# Patient Record
Sex: Male | Born: 1985 | Race: Black or African American | Hispanic: No | Marital: Married | Smoking: Current some day smoker
Health system: Southern US, Community
[De-identification: ages and names within clinical notes are randomized; demographics above are authoritative.]

## PROBLEM LIST (undated history)

## (undated) HISTORY — PX: HERNIA REPAIR: SHX51

## (undated) HISTORY — PX: COLONOSCOPY: SHX174

---

## 2021-07-18 ENCOUNTER — Emergency Department
Admission: EM | Admit: 2021-07-18 | Discharge: 2021-07-18 | Disposition: A | Discharge status: Home / Self Care | Payer: Managed Care, Other (non HMO) | Attending: Emergency Medicine | Admitting: Emergency Medicine

## 2021-07-18 ENCOUNTER — Encounter: Payer: Self-pay | Admitting: Emergency Medicine

## 2021-07-18 ENCOUNTER — Emergency Department: Payer: Managed Care, Other (non HMO)

## 2021-07-18 ENCOUNTER — Other Ambulatory Visit: Payer: Self-pay

## 2021-07-18 DIAGNOSIS — Z5321 Procedure and treatment not carried out due to patient leaving prior to being seen by health care provider: Secondary | ICD-10-CM | POA: Diagnosis not present

## 2021-07-18 DIAGNOSIS — R197 Diarrhea, unspecified: Secondary | ICD-10-CM | POA: Diagnosis not present

## 2021-07-18 DIAGNOSIS — R1031 Right lower quadrant pain: Secondary | ICD-10-CM | POA: Diagnosis not present

## 2021-07-18 DIAGNOSIS — R110 Nausea: Secondary | ICD-10-CM | POA: Diagnosis not present

## 2021-07-18 LAB — URINALYSIS, COMPLETE (UACMP) WITH MICROSCOPIC
Bacteria, UA: NONE SEEN
Bilirubin Urine: NEGATIVE
Glucose, UA: NEGATIVE mg/dL
Hgb urine dipstick: NEGATIVE
Ketones, ur: NEGATIVE mg/dL
Leukocytes,Ua: NEGATIVE
Nitrite: NEGATIVE
Protein, ur: NEGATIVE mg/dL
Specific Gravity, Urine: 1.023 (ref 1.005–1.030)
Squamous Epithelial / HPF: NONE SEEN (ref 0–5)
pH: 6 (ref 5.0–8.0)

## 2021-07-18 LAB — COMPREHENSIVE METABOLIC PANEL
ALT: 43 U/L (ref 0–44)
AST: 28 U/L (ref 15–41)
Albumin: 3.9 g/dL (ref 3.5–5.0)
Alkaline Phosphatase: 67 U/L (ref 38–126)
Anion gap: 7 (ref 5–15)
BUN: 17 mg/dL (ref 6–20)
CO2: 28 mmol/L (ref 22–32)
Calcium: 9 mg/dL (ref 8.9–10.3)
Chloride: 103 mmol/L (ref 98–111)
Creatinine, Ser: 1.38 mg/dL — ABNORMAL HIGH (ref 0.61–1.24)
GFR, Estimated: >60 mL/min (ref >60)
Glucose, Bld: 94 mg/dL (ref 70–99)
Potassium: 3.7 mmol/L (ref 3.5–5.1)
Sodium: 138 mmol/L (ref 135–145)
Total Bilirubin: 0.8 mg/dL (ref 0.3–1.2)
Total Protein: 7.7 g/dL (ref 6.5–8.1)

## 2021-07-18 LAB — CBC
HCT: 44.7 % (ref 39.0–52.0)
Hemoglobin: 15.8 g/dL (ref 13.0–17.0)
MCH: 28.9 pg (ref 26.0–34.0)
MCHC: 35.3 g/dL (ref 30.0–36.0)
MCV: 81.9 fL (ref 80.0–100.0)
Platelets: 183 10*3/uL (ref 150–400)
RBC: 5.46 MIL/uL (ref 4.22–5.81)
RDW: 13 % (ref 11.5–15.5)
WBC: 10.2 10*3/uL (ref 4.0–10.5)
nRBC: 0 % (ref 0.0–0.2)

## 2021-07-18 LAB — LIPASE, BLOOD: Lipase: 38 U/L (ref 11–51)

## 2021-07-18 MED ORDER — IOHEXOL 350 MG/ML SOLN
125.0000 mL | Freq: Once | INTRAVENOUS | Status: AC | PRN
  Administered 2021-07-18: 125 mL via INTRAVENOUS

## 2021-07-18 NOTE — ED Triage Notes (Addendum)
PT arrived via POV with c/o RLQ abd pain states the pain started in abdomen around umbilicus and moved to RLQ which woke him up during the night.  Pt states RLQ abd present with ambulation. Appears uncomfortable in triage.  Pt reports nausea, denies vomiting, has had some increased stools. Pt denies fevers, chills.  Pt reports last food intake: Wednesday evening around 5p or 6p  Pt reports last liquid intake: 830pm

## 2021-07-18 NOTE — ED Notes (Signed)
Pt decided to leave before being seen, states he will call his PCP and states he was able to see his records on MyChart. Encouraged patient to return to ED for worsening pain, fevers, vomiting. Encouraged patient to follow up at an UC if he did not want to return to ED and was not able to get into his PCP's office quickly. Pt verbalized understanding. Ambulatory to lobby without difficulty, waiting for ride.

## 2021-08-13 ENCOUNTER — Ambulatory Visit: Payer: Self-pay | Admitting: General Surgery

## 2021-08-13 NOTE — H&P (View-Only) (Signed)
PATIENT PROFILE: John Warner is a 35 y.o. male who presents to the Clinic for consultation at the request of John Warner, [place] for evaluation of umbilical hernia.  PCP:  [name], PA  HISTORY OF PRESENT ILLNESS: Mr. John Warner reports having pain in the periumbilical area since 3 to 4 weeks ago.  He endorses that he thought that he had appendicitis and he went to the emergency room.  At the emergency room he had a CT scan of the abdomen and pelvis that showed a small fat-containing umbilical hernia.  The patient reports that the pain is localized to the periumbilical area.  No pain radiation.  Pain aggravated by certain abdominal wall movements.  There has been no alleviating factors.  Patient denies any abdominal distention nausea or vomiting.  Patient denies any previous abdominal surgeries.   PROBLEM LIST: Problem List  Date Reviewed: 08/05/2021          Noted   Acute internal derangement of left knee 01/14/2018   Effusion of left knee 01/14/2018       GENERAL REVIEW OF SYSTEMS:   General ROS: negative for - chills, fatigue, fever, weight gain or weight loss Allergy and Immunology ROS: negative for - hives  Hematological and Lymphatic ROS: negative for - bleeding problems or bruising, negative for palpable nodes Endocrine ROS: negative for - heat or cold intolerance, hair changes Respiratory ROS: negative for - cough, shortness of breath or wheezing Cardiovascular ROS: no chest pain or palpitations GI ROS: negative for nausea, vomiting, abdominal pain, diarrhea, constipation Musculoskeletal ROS: negative for - joint swelling or muscle pain Neurological ROS: negative for - confusion, syncope Dermatological ROS: negative for pruritus and rash Psychiatric: negative for anxiety, depression, difficulty sleeping and memory loss  MEDICATIONS: No current outpatient medications on file.   No current facility-administered medications for this visit.    ALLERGIES: Patient has no  known allergies.  PAST MEDICAL HISTORY: History reviewed. No pertinent past medical history.  PAST SURGICAL HISTORY: Past Surgical History:  Procedure Laterality Date   HERNIA REPAIR     SURVEILLANCE COLONOSCOPY N/A 05/18/2014   Procedure: SURVEILLANCE COLONOSCOPY;  Surgeon: [name], MD;  Location: [facility];  Service: Gastroenterology;  Laterality: N/A;     FAMILY HISTORY: Family History  Problem Relation Age of Onset   High blood pressure (Hypertension) Mother    Colon cancer Maternal Uncle    Colon cancer Maternal Grandfather 22     SOCIAL HISTORY: Social History   Socioeconomic History   Marital status: Married    Spouse name: Therapist, nutritional   Number of children: 4  Occupational History   Occupation: [employer]  Tobacco Use   Smoking status: Never Smoker   Smokeless tobacco: Never Used  Scientific laboratory technician Use: Never used  Substance and Sexual Activity   Alcohol use: Not Currently    Comment: once per month   Drug use: No   Sexual activity: Yes    Partners: Female    Birth control/protection: Implant    PHYSICAL EXAM: Vitals:   08/13/21 1034  BP: 116/78  Pulse: 70   Body mass index is 35.89 kg/m. Weight: (!) 118.4 kg (261 lb)   GENERAL: Alert, active, oriented x3  HEENT: Pupils equal reactive to light. Extraocular movements are intact. Sclera clear. Palpebral conjunctiva normal red color.Pharynx clear.  NECK: Supple with no palpable mass and no adenopathy.  LUNGS: Sound clear with no rales rhonchi or wheezes.  HEART: Regular  rhythm S1 and S2 without murmur.  ABDOMEN: Soft and depressible, nontender with no palpable mass, no hepatomegaly.  Small tender fat-containing umbilical hernia.  Unable to be completely reduced.  EXTREMITIES: Well-developed well-nourished symmetrical with no dependent edema.  NEUROLOGICAL: Awake alert oriented, facial expression symmetrical, moving all extremities.  REVIEW OF  DATA: I have reviewed the following data today: Initial consult on 08/05/2021  Component Date Value   WBC (White Blood Cell Co* 08/05/2021 8.4    RBC (Red Blood Cell Coun* 08/05/2021 5.60    Hemoglobin 08/05/2021 15.3    Hematocrit 08/05/2021 45.7    MCV (Mean Corpuscular Vo* 08/05/2021 81.6    MCH (Mean Corpuscular He* 08/05/2021 27.3    MCHC (Mean Corpuscular H* 08/05/2021 33.5    Platelet Count 08/05/2021 193    RDW-CV (Red Cell Distrib* 08/05/2021 13.6    MPV (Mean Platelet Volum* 08/05/2021 11.6    Neutrophils 08/05/2021 5.70    Lymphocytes 08/05/2021 2.10    Mixed Count 08/05/2021 0.60    Neutrophil % 08/05/2021 68.0    Lymphocyte % 08/05/2021 25.0    Mixed % 08/05/2021 7.0    Glucose 08/05/2021 89    Sodium 08/05/2021 140    Potassium 08/05/2021 4.2    Chloride 08/05/2021 105    Carbon Dioxide (CO2) 08/05/2021 27.9    Urea Nitrogen (BUN) 08/05/2021 11    Creatinine 08/05/2021 1.2    Glomerular Filtration Ra* 08/05/2021 85    Calcium 08/05/2021 9.5    AST  08/05/2021 19    ALT  08/05/2021 26    Alk Phos (alkaline Phosp* 08/05/2021 78    Albumin 08/05/2021 4.2    Bilirubin, Total 08/05/2021 0.5    Protein, Total 08/05/2021 7.2    A/G Ratio 08/05/2021 1.4    Hemoglobin A1C 08/05/2021 5.1    Average Blood Glucose (C* 08/05/2021 100    Cholesterol, Total 08/05/2021 160    Triglyceride 08/05/2021 53    HDL (High Density Lipopr* 08/05/2021 42.1    LDL Calculated 08/05/2021 107    VLDL Cholesterol 08/05/2021 11    Cholesterol/HDL Ratio 08/05/2021 3.8    Thyroid Stimulating Horm* 08/05/2021 0.470      ASSESSMENT: Mr. John Warner is a 35 y.o. male presenting for consultation for umbilical hernia.    The patient presents with a symptomatic, umbilical hernia. Patient was oriented about the diagnosis of umbilical hernia and its implication. The patient was oriented about the treatment alternatives (observation vs surgical repair). Due to patient symptoms, repair is  recommended. Patient oriented about the surgical procedure, the recommendation of use of mesh and its risk of complications such as: infection, bleeding, injury to vas deference, vasculature and testicle, injury to bowel or bladder, and chronic pain.   Patient endorses that he will like to avoid mesh placement.  I discussed with the patient that if the size defect is not significantly big the umbilical hernia can be fixed primarily.  He still would like to avoid the use of mesh and requested to have the umbilical hernia repaired primarily.  I would not expect a defect more than 2 cm.  I discussed with patient that he has a high risk of recurrence but since the defect does not seems to be significantly wide it is not unreasonable to fix the hernia with primary repair.  Umbilical hernia without obstruction and without gangrene [K42.9]  PLAN: Open umbilical hernia repair (40347) CBC, CMP (done) Avoid taking aspirin 5 days before surgery Contact us if you have any  concern.   Patient verbalized understanding, all questions were answered, and were agreeable with the plan outlined above.    Herbert Pun, MD

## 2021-08-13 NOTE — H&P (Signed)
PATIENT PROFILE: John Warner is a 35 y.o. male who presents to the Clinic for consultation at the request of John Warner, [place] for evaluation of umbilical hernia.  PCP:  John Colonel, PA  HISTORY OF PRESENT ILLNESS: John Warner reports having pain in the periumbilical area since 3 to 4 weeks ago.  He endorses that he thought that he had appendicitis and he went to the emergency room.  At the emergency room he had a CT scan of the abdomen and pelvis that showed a small fat-containing umbilical hernia.  The patient reports that the pain is localized to the periumbilical area.  No pain radiation.  Pain aggravated by certain abdominal wall movements.  There has been no alleviating factors.  Patient denies any abdominal distention nausea or vomiting.  Patient denies any previous abdominal surgeries.   PROBLEM LIST: Problem List  Date Reviewed: 08/05/2021          Noted   Acute internal derangement of left knee 01/14/2018   Effusion of left knee 01/14/2018       GENERAL REVIEW OF SYSTEMS:   General ROS: negative for - chills, fatigue, fever, weight gain or weight loss Allergy and Immunology ROS: negative for - hives  Hematological and Lymphatic ROS: negative for - bleeding problems or bruising, negative for palpable nodes Endocrine ROS: negative for - heat or cold intolerance, hair changes Respiratory ROS: negative for - cough, shortness of breath or wheezing Cardiovascular ROS: no chest pain or palpitations GI ROS: negative for nausea, vomiting, abdominal pain, diarrhea, constipation Musculoskeletal ROS: negative for - joint swelling or muscle pain Neurological ROS: negative for - confusion, syncope Dermatological ROS: negative for pruritus and rash Psychiatric: negative for anxiety, depression, difficulty sleeping and memory loss  MEDICATIONS: No current outpatient medications on file.   No current facility-administered medications for this visit.    ALLERGIES: Patient has no  known allergies.  PAST MEDICAL HISTORY: History reviewed. No pertinent past medical history.  PAST SURGICAL HISTORY: Past Surgical History:  Procedure Laterality Date   HERNIA REPAIR     SURVEILLANCE COLONOSCOPY N/A 05/18/2014   Procedure: SURVEILLANCE COLONOSCOPY;  Surgeon: John Litter, MD;  Location: [facility];  Service: Gastroenterology;  Laterality: N/A;     FAMILY HISTORY: Family History  Problem Relation Age of Onset   High blood pressure (Hypertension) Mother    Colon cancer Maternal Uncle    Colon cancer Maternal Grandfather 56     SOCIAL HISTORY: Social History   Socioeconomic History   Marital status: Married    Spouse name: Therapist, nutritional   Number of children: 4  Occupational History   Occupation: [employer]  Tobacco Use   Smoking status: Never Smoker   Smokeless tobacco: Never Used  Scientific laboratory technician Use: Never used  Substance and Sexual Activity   Alcohol use: Not Currently    Comment: once per month   Drug use: No   Sexual activity: Yes    Partners: Female    Birth control/protection: Implant    PHYSICAL EXAM: Vitals:   08/13/21 1034  BP: 116/78  Pulse: 70   Body mass index is 35.89 kg/m. Weight: (!) 118.4 kg (261 lb)   GENERAL: Alert, active, oriented x3  HEENT: Pupils equal reactive to light. Extraocular movements are intact. Sclera clear. Palpebral conjunctiva normal red color.Pharynx clear.  NECK: Supple with no palpable mass and no adenopathy.  LUNGS: Sound clear with no rales rhonchi or wheezes.  HEART: Regular  rhythm S1 and S2 without murmur.  ABDOMEN: Soft and depressible, nontender with no palpable mass, no hepatomegaly.  Small tender fat-containing umbilical hernia.  Unable to be completely reduced.  EXTREMITIES: Well-developed well-nourished symmetrical with no dependent edema.  NEUROLOGICAL: Awake alert oriented, facial expression symmetrical, moving all extremities.  REVIEW OF  DATA: I have reviewed the following data today: Initial consult on 08/05/2021  Component Date Value   WBC (White Blood Cell Co* 08/05/2021 8.4    RBC (Red Blood Cell Coun* 08/05/2021 5.60    Hemoglobin 08/05/2021 15.3    Hematocrit 08/05/2021 45.7    MCV (Mean Corpuscular Vo* 08/05/2021 81.6    MCH (Mean Corpuscular He* 08/05/2021 27.3    MCHC (Mean Corpuscular H* 08/05/2021 33.5    Platelet Count 08/05/2021 193    RDW-CV (Red Cell Distrib* 08/05/2021 13.6    MPV (Mean Platelet Volum* 08/05/2021 11.6    Neutrophils 08/05/2021 5.70    Lymphocytes 08/05/2021 2.10    Mixed Count 08/05/2021 0.60    Neutrophil % 08/05/2021 68.0    Lymphocyte % 08/05/2021 25.0    Mixed % 08/05/2021 7.0    Glucose 08/05/2021 89    Sodium 08/05/2021 140    Potassium 08/05/2021 4.2    Chloride 08/05/2021 105    Carbon Dioxide (CO2) 08/05/2021 27.9    Urea Nitrogen (BUN) 08/05/2021 11    Creatinine 08/05/2021 1.2    Glomerular Filtration Ra* 08/05/2021 85    Calcium 08/05/2021 9.5    AST  08/05/2021 19    ALT  08/05/2021 26    Alk Phos (alkaline Phosp* 08/05/2021 78    Albumin 08/05/2021 4.2    Bilirubin, Total 08/05/2021 0.5    Protein, Total 08/05/2021 7.2    A/G Ratio 08/05/2021 1.4    Hemoglobin A1C 08/05/2021 5.1    Average Blood Glucose (C* 08/05/2021 100    Cholesterol, Total 08/05/2021 160    Triglyceride 08/05/2021 53    HDL (High Density Lipopr* 08/05/2021 42.1    LDL Calculated 08/05/2021 107    VLDL Cholesterol 08/05/2021 11    Cholesterol/HDL Ratio 08/05/2021 3.8    Thyroid Stimulating Horm* 08/05/2021 0.470      ASSESSMENT: Mr. John Warner is a 35 y.o. male presenting for consultation for umbilical hernia.    The patient presents with a symptomatic, umbilical hernia. Patient was oriented about the diagnosis of umbilical hernia and its implication. The patient was oriented about the treatment alternatives (observation vs surgical repair). Due to patient symptoms, repair is  recommended. Patient oriented about the surgical procedure, the recommendation of use of mesh and its risk of complications such as: infection, bleeding, injury to vas deference, vasculature and testicle, injury to bowel or bladder, and chronic pain.   Patient endorses that he will like to avoid mesh placement.  I discussed with the patient that if the size defect is not significantly big the umbilical hernia can be fixed primarily.  He still would like to avoid the use of mesh and requested to have the umbilical hernia repaired primarily.  I would not expect a defect more than 2 cm.  I discussed with patient that he has a high risk of recurrence but since the defect does not seems to be significantly wide it is not unreasonable to fix the hernia with primary repair.  Umbilical hernia without obstruction and without gangrene [K42.9]  PLAN: Open umbilical hernia repair (50539) CBC, CMP (done) Avoid taking aspirin 5 days before surgery Contact us if you have any  concern.   Patient verbalized understanding, all questions were answered, and were agreeable with the plan outlined above.    Herbert Pun, MD

## 2021-08-15 ENCOUNTER — Other Ambulatory Visit: Payer: Self-pay

## 2021-08-15 ENCOUNTER — Encounter
Admission: RE | Admit: 2021-08-15 | Discharge: 2021-08-15 | Disposition: A | Discharge status: Home / Self Care | Payer: Managed Care, Other (non HMO) | Source: Ambulatory Visit | Attending: Orthopedic Surgery | Admitting: Orthopedic Surgery

## 2021-08-15 NOTE — Patient Instructions (Signed)
Your procedure is scheduled on:08-21-21 Wednesday Report to the Registration Desk on the 1st floor of the Medical Mall.Then proceed to the 2nd floor Surgery Desk in the Medical Mall To find out your arrival time, please call [phone] between 1PM - 3PM on:08-20-21 Tuesday  REMEMBER: Instructions that are not followed completely may result in serious medical risk, up to and including death; or upon the discretion of your surgeon and anesthesiologist your surgery may need to be rescheduled.  Do not eat food after midnight the night before surgery.  No gum chewing, lozengers or hard candies.  You may however, drink CLEAR liquids up to 2 hours before you are scheduled to arrive for your surgery. Do not drink anything within 2 hours of your scheduled arrival time.  Clear liquids include: - water  - apple juice without pulp - gatorade (not RED, PURPLE, OR BLUE) - black coffee or tea (Do NOT add milk or creamers to the coffee or tea) Do NOT drink anything that is not on this list.  Do not take any medication the day of surgery  One week prior to surgery: Stop Anti-inflammatories (NSAIDS) such as Advil, Aleve, Ibuprofen, Motrin, Naproxen, Naprosyn and Aspirin based products such as Excedrin, Goodys Powder, BC Powder.You may however, continue to take Tylenol if needed for pain up until the day of surgery.  Stop ANY OVER THE COUNTER supplements/vitamins NOW (08-15-21) until after surgery.  No Alcohol for 24 hours before or after surgery.  No Smoking including e-cigarettes for 24 hours prior to surgery.  No chewable tobacco products for at least 6 hours prior to surgery.  No nicotine patches on the day of surgery.  Do not use any "recreational" drugs for at least a week prior to your surgery.  Please be advised that the combination of cocaine and anesthesia may have negative outcomes, up to and including death. If you test positive for cocaine, your surgery will be cancelled.  On the morning  of surgery brush your teeth with toothpaste and water, you may rinse your mouth with mouthwash if you wish. Do not swallow any toothpaste or mouthwash.  Do not wear jewelry, make-up, hairpins, clips or nail polish.  Do not wear lotions, powders, or perfumes.   Do not shave body from the neck down 48 hours prior to surgery just in case you cut yourself which could leave a site for infection.  Also, freshly shaved skin may become irritated if using the CHG soap.  Contact lenses, hearing aids and dentures may not be worn into surgery.  Do not bring valuables to the hospital. [facility] is not responsible for any missing/lost belongings or valuables.   Use CHG Soap as directed on instruction sheet.  Notify your doctor if there is any change in your medical condition (cold, fever, infection).  Wear comfortable clothing (specific to your surgery type) to the hospital.  After surgery, you can help prevent lung complications by doing breathing exercises.  Take deep breaths and cough every 1-2 hours. Your doctor may order a device called an Incentive Spirometer to help you take deep breaths. When coughing or sneezing, hold a pillow firmly against your incision with both hands. This is called "splinting." Doing this helps protect your incision. It also decreases belly discomfort.  If you are being admitted to the hospital overnight, leave your suitcase in the car. After surgery it may be brought to your room.  If you are being discharged the day of surgery, you will not be  allowed to drive home. You will need a responsible adult (18 years or older) to drive you home and stay with you that night.   If you are taking public transportation, you will need to have a responsible adult (18 years or older) with you. Please confirm with your physician that it is acceptable to use public transportation.   Please call the [facility] at [phone] if you have any questions about  these instructions.  Surgery Visitation Policy:  Patients undergoing a surgery or procedure may have one family member or support person with them as long as that person is not COVID-19 positive or experiencing its symptoms.  That person may remain in the waiting area during the procedure.  Inpatient Visitation:    Visiting hours are 7 a.m. to 8 p.m. Inpatients will be allowed two visitors daily. The visitors may change each day during the patient's stay. No visitors under the age of 5. Any visitor under the age of 30 must be accompanied by an adult. The visitor must pass COVID-19 screenings, use hand sanitizer when entering and exiting the patient's room and wear a mask at all times, including in the patient's room. Patients must also wear a mask when staff or their visitor are in the room. Masking is required regardless of vaccination status.

## 2021-08-21 ENCOUNTER — Encounter: Payer: Self-pay | Admitting: General Surgery

## 2021-08-21 ENCOUNTER — Ambulatory Visit: Payer: Managed Care, Other (non HMO) | Admitting: Anesthesiology

## 2021-08-21 ENCOUNTER — Other Ambulatory Visit: Payer: Self-pay

## 2021-08-21 ENCOUNTER — Encounter
Admission: RE | Disposition: A | Discharge status: Home / Self Care | Payer: Self-pay | Source: Ambulatory Visit | Attending: General Surgery

## 2021-08-21 ENCOUNTER — Ambulatory Visit
Admission: RE | Admit: 2021-08-21 | Discharge: 2021-08-21 | Disposition: A | Discharge status: Home / Self Care | Payer: Managed Care, Other (non HMO) | Source: Ambulatory Visit | Attending: General Surgery | Admitting: General Surgery

## 2021-08-21 DIAGNOSIS — K429 Umbilical hernia without obstruction or gangrene: Secondary | ICD-10-CM | POA: Insufficient documentation

## 2021-08-21 DIAGNOSIS — R1905 Periumbilic swelling, mass or lump: Secondary | ICD-10-CM | POA: Diagnosis not present

## 2021-08-21 HISTORY — PX: UMBILICAL HERNIA REPAIR: SHX196

## 2021-08-21 SURGERY — REPAIR, HERNIA, UMBILICAL, ADULT
Anesthesia: General | Site: Abdomen

## 2021-08-21 MED ORDER — ACETAMINOPHEN 10 MG/ML IV SOLN
INTRAVENOUS | Status: DC | PRN
  Administered 2021-08-21: 1000 mg via INTRAVENOUS

## 2021-08-21 MED ORDER — FAMOTIDINE 20 MG PO TABS
ORAL_TABLET | ORAL | Status: AC
  Filled 2021-08-21: qty 1

## 2021-08-21 MED ORDER — MIDAZOLAM HCL 2 MG/2ML IJ SOLN
INTRAMUSCULAR | Status: DC | PRN
  Administered 2021-08-21: 2 mg via INTRAVENOUS

## 2021-08-21 MED ORDER — ONDANSETRON HCL 4 MG/2ML IJ SOLN: 4.0000 mg | Freq: Once | INTRAMUSCULAR | Status: DC | PRN

## 2021-08-21 MED ORDER — SUGAMMADEX SODIUM 500 MG/5ML IV SOLN
INTRAVENOUS | Status: AC
  Filled 2021-08-21: qty 5

## 2021-08-21 MED ORDER — LIDOCAINE HCL (PF) 2 % IJ SOLN
INTRAMUSCULAR | Status: AC
  Filled 2021-08-21: qty 5

## 2021-08-21 MED ORDER — HYDROCODONE-ACETAMINOPHEN 5-325 MG PO TABS
ORAL_TABLET | ORAL | Status: AC
  Administered 2021-08-21: 1 via ORAL
  Filled 2021-08-21: qty 1

## 2021-08-21 MED ORDER — DEXAMETHASONE SODIUM PHOSPHATE 10 MG/ML IJ SOLN
INTRAMUSCULAR | Status: AC
  Filled 2021-08-21: qty 1

## 2021-08-21 MED ORDER — KETOROLAC TROMETHAMINE 30 MG/ML IJ SOLN
INTRAMUSCULAR | Status: DC | PRN
  Administered 2021-08-21: 30 mg via INTRAVENOUS

## 2021-08-21 MED ORDER — STERILE WATER FOR IRRIGATION IR SOLN
Status: DC | PRN
  Administered 2021-08-21: 500 mL

## 2021-08-21 MED ORDER — FAMOTIDINE 20 MG PO TABS
20.0000 mg | ORAL_TABLET | Freq: Once | ORAL | Status: AC
  Administered 2021-08-21: 20 mg via ORAL

## 2021-08-21 MED ORDER — BUPIVACAINE-EPINEPHRINE (PF) 0.5% -1:200000 IJ SOLN
INTRAMUSCULAR | Status: AC
  Filled 2021-08-21: qty 30

## 2021-08-21 MED ORDER — PROPOFOL 10 MG/ML IV BOLUS
INTRAVENOUS | Status: DC | PRN
  Administered 2021-08-21: 200 mg via INTRAVENOUS

## 2021-08-21 MED ORDER — ONDANSETRON HCL 4 MG/2ML IJ SOLN
INTRAMUSCULAR | Status: AC
  Filled 2021-08-21: qty 2

## 2021-08-21 MED ORDER — KETOROLAC TROMETHAMINE 30 MG/ML IJ SOLN
INTRAMUSCULAR | Status: AC
  Filled 2021-08-21: qty 1

## 2021-08-21 MED ORDER — ONDANSETRON HCL 4 MG/2ML IJ SOLN
INTRAMUSCULAR | Status: DC | PRN
  Administered 2021-08-21: 4 mg via INTRAVENOUS

## 2021-08-21 MED ORDER — FENTANYL CITRATE (PF) 100 MCG/2ML IJ SOLN
INTRAMUSCULAR | Status: AC
  Filled 2021-08-21: qty 2

## 2021-08-21 MED ORDER — CHLORHEXIDINE GLUCONATE 0.12 % MT SOLN
15.0000 mL | Freq: Once | OROMUCOSAL | Status: AC
  Administered 2021-08-21: 15 mL via OROMUCOSAL

## 2021-08-21 MED ORDER — DEXMEDETOMIDINE (PRECEDEX) IN NS 20 MCG/5ML (4 MCG/ML) IV SYRINGE
PREFILLED_SYRINGE | INTRAVENOUS | Status: DC | PRN
  Administered 2021-08-21 (×2): 4 ug via INTRAVENOUS

## 2021-08-21 MED ORDER — ROCURONIUM BROMIDE 100 MG/10ML IV SOLN
INTRAVENOUS | Status: DC | PRN
  Administered 2021-08-21: 50 mg via INTRAVENOUS

## 2021-08-21 MED ORDER — CEFAZOLIN SODIUM-DEXTROSE 2-4 GM/100ML-% IV SOLN
2.0000 g | INTRAVENOUS | Status: AC
  Administered 2021-08-21: 2 g via INTRAVENOUS

## 2021-08-21 MED ORDER — FENTANYL CITRATE (PF) 100 MCG/2ML IJ SOLN
INTRAMUSCULAR | Status: AC
  Administered 2021-08-21: 25 ug via INTRAVENOUS
  Filled 2021-08-21: qty 2

## 2021-08-21 MED ORDER — ACETAMINOPHEN 10 MG/ML IV SOLN
INTRAVENOUS | Status: AC
  Filled 2021-08-21: qty 100

## 2021-08-21 MED ORDER — PROPOFOL 10 MG/ML IV BOLUS
INTRAVENOUS | Status: AC
  Filled 2021-08-21: qty 20

## 2021-08-21 MED ORDER — FENTANYL CITRATE (PF) 100 MCG/2ML IJ SOLN
25.0000 ug | INTRAMUSCULAR | Status: DC | PRN
  Administered 2021-08-21 (×3): 25 ug via INTRAVENOUS

## 2021-08-21 MED ORDER — BUPIVACAINE-EPINEPHRINE (PF) 0.5% -1:200000 IJ SOLN
INTRAMUSCULAR | Status: DC | PRN
  Administered 2021-08-21: 30 mL

## 2021-08-21 MED ORDER — SUGAMMADEX SODIUM 500 MG/5ML IV SOLN
INTRAVENOUS | Status: DC | PRN
  Administered 2021-08-21: 300 mg via INTRAVENOUS

## 2021-08-21 MED ORDER — LACTATED RINGERS IV SOLN: INTRAVENOUS | Status: DC

## 2021-08-21 MED ORDER — 0.9 % SODIUM CHLORIDE (POUR BTL) OPTIME
TOPICAL | Status: DC | PRN
  Administered 2021-08-21: 500 mL

## 2021-08-21 MED ORDER — CHLORHEXIDINE GLUCONATE 0.12 % MT SOLN
OROMUCOSAL | Status: AC
  Filled 2021-08-21: qty 15

## 2021-08-21 MED ORDER — MEPERIDINE HCL 25 MG/ML IJ SOLN: 6.2500 mg | INTRAMUSCULAR | Status: DC | PRN

## 2021-08-21 MED ORDER — FENTANYL CITRATE (PF) 100 MCG/2ML IJ SOLN
INTRAMUSCULAR | Status: DC | PRN
  Administered 2021-08-21: 50 ug via INTRAVENOUS
  Administered 2021-08-21: 100 ug via INTRAVENOUS

## 2021-08-21 MED ORDER — ORAL CARE MOUTH RINSE: 15.0000 mL | Freq: Once | OROMUCOSAL | Status: AC

## 2021-08-21 MED ORDER — ROCURONIUM BROMIDE 10 MG/ML (PF) SYRINGE
PREFILLED_SYRINGE | INTRAVENOUS | Status: AC
  Filled 2021-08-21: qty 10

## 2021-08-21 MED ORDER — DEXAMETHASONE SODIUM PHOSPHATE 10 MG/ML IJ SOLN
INTRAMUSCULAR | Status: DC | PRN
  Administered 2021-08-21: 10 mg via INTRAVENOUS

## 2021-08-21 MED ORDER — MIDAZOLAM HCL 2 MG/2ML IJ SOLN
INTRAMUSCULAR | Status: AC
  Filled 2021-08-21: qty 2

## 2021-08-21 MED ORDER — CEFAZOLIN SODIUM-DEXTROSE 2-4 GM/100ML-% IV SOLN
INTRAVENOUS | Status: AC
  Filled 2021-08-21: qty 100

## 2021-08-21 MED ORDER — LIDOCAINE HCL (CARDIAC) PF 100 MG/5ML IV SOSY
PREFILLED_SYRINGE | INTRAVENOUS | Status: DC | PRN
  Administered 2021-08-21: 100 mg via INTRAVENOUS

## 2021-08-21 MED ORDER — HYDROCODONE-ACETAMINOPHEN 5-325 MG PO TABS: 1.0000 | ORAL_TABLET | Freq: Four times a day (QID) | ORAL | Status: DC | PRN

## 2021-08-21 MED ORDER — HYDROCODONE-ACETAMINOPHEN 5-325 MG PO TABS: 1.0000 | ORAL_TABLET | ORAL | 0 refills | Status: AC | PRN

## 2021-08-21 SURGICAL SUPPLY — 37 items
ADH SKN CLS APL DERMABOND .7 (GAUZE/BANDAGES/DRESSINGS) ×1
APL PRP STRL LF DISP 70% ISPRP (MISCELLANEOUS) ×1
BLADE CLIPPER SURG (BLADE) ×2 IMPLANT
BLADE SURG 15 STRL LF DISP TIS (BLADE) ×1 IMPLANT
BLADE SURG 15 STRL SS (BLADE) ×2
CHLORAPREP W/TINT 26 (MISCELLANEOUS) ×2 IMPLANT
CLIPPER SURGICAL HAIR RECHARGE (MISCELLANEOUS)
DERMABOND ADVANCED (GAUZE/BANDAGES/DRESSINGS) ×1
DERMABOND ADVANCED .7 DNX12 (GAUZE/BANDAGES/DRESSINGS) ×1 IMPLANT
DRAPE LAPAROTOMY 77X122 PED (DRAPES) ×2 IMPLANT
ELECT REM PT RETURN 9FT ADLT (ELECTROSURGICAL) ×2
ELECTRODE REM PT RTRN 9FT ADLT (ELECTROSURGICAL) ×1 IMPLANT
FORMALIN 90ML LC-0090 (MISCELLANEOUS) ×2 IMPLANT
GAUZE 4X4 16PLY ~~LOC~~+RFID DBL (SPONGE) ×2 IMPLANT
GLOVE SURG ENC MOIS LTX SZ6.5 (GLOVE) ×2 IMPLANT
GLOVE SURG UNDER POLY LF SZ6.5 (GLOVE) ×2 IMPLANT
GOWN STRL REUS W/ TWL LRG LVL3 (GOWN DISPOSABLE) ×3 IMPLANT
GOWN STRL REUS W/TWL LRG LVL3 (GOWN DISPOSABLE) ×6
KIT TURNOVER KIT A (KITS) ×2 IMPLANT
LABEL OR SOLS (LABEL) ×2 IMPLANT
MANIFOLD NEPTUNE II (INSTRUMENTS) ×2 IMPLANT
MESH VENTRALEX ST 8CM LRG (Mesh General) IMPLANT
MESH VENTRALEX ST CIRCLE MED (Mesh General) IMPLANT
NEEDLE HYPO 25X1 1.5 SAFETY (NEEDLE) ×2 IMPLANT
NS IRRIG 500ML POUR BTL (IV SOLUTION) ×2 IMPLANT
PACK BASIN MINOR ARMC (MISCELLANEOUS) ×2 IMPLANT
SURGICAL HAIR CLIPPER RECHARGE (MISCELLANEOUS) IMPLANT
SUT ETHIBOND 0 MO6 C/R (SUTURE) ×2 IMPLANT
SUT MNCRL 4-0 (SUTURE) ×2
SUT MNCRL 4-0 27XMFL (SUTURE) ×1
SUT PROLENE 0 CT 2 (SUTURE) ×2 IMPLANT
SUT VIC AB 0 CT2 27 (SUTURE) ×2 IMPLANT
SUT VIC AB 2-0 SH 27 (SUTURE) ×2
SUT VIC AB 2-0 SH 27XBRD (SUTURE) ×1 IMPLANT
SUTURE MNCRL 4-0 27XMF (SUTURE) ×1 IMPLANT
SYR 10ML LL (SYRINGE) ×2 IMPLANT
WATER STERILE IRR 500ML POUR (IV SOLUTION) ×2 IMPLANT

## 2021-08-21 NOTE — Anesthesia Postprocedure Evaluation (Signed)
Anesthesia Post Note  Patient: John Warner  Procedure(s) Performed: HERNIA REPAIR UMBILICAL ADULT (Abdomen)  Patient location during evaluation: PACU Anesthesia Type: General Level of consciousness: awake and alert, awake and oriented Pain management: pain level controlled Vital Signs Assessment: post-procedure vital signs reviewed and stable Respiratory status: spontaneous breathing, nonlabored ventilation and respiratory function stable Cardiovascular status: blood pressure returned to baseline and stable Postop Assessment: no apparent nausea or vomiting Anesthetic complications: no   No notable events documented.   Last Vitals:  Vitals:   08/21/21 1300 08/21/21 1321  BP: 122/76   Pulse: (!) 50 (!) 52  Resp: 13 18  Temp: (!) 36.3 C (!) 36.2 C  SpO2: 95% 100%    Last Pain:  Vitals:   08/21/21 1321  TempSrc:   PainSc: 3                  Manfred Arch

## 2021-08-21 NOTE — Interval H&P Note (Signed)
History and Physical Interval Note:  08/21/2021 9:45 AM  John Warner  has presented today for surgery, with the diagnosis of K42.9 umbilical hernia w/o obstruction or gangrene.  The various methods of treatment have been discussed with the patient and family. After consideration of risks, benefits and other options for treatment, the patient has consented to  Procedure(s): HERNIA REPAIR UMBILICAL ADULT (N/A) as a surgical intervention.  The patient's history has been reviewed, patient examined, no change in status, stable for surgery.  I have reviewed the patient's chart and labs.  Questions were answered to the patient's satisfaction.     Carolan Shiver

## 2021-08-21 NOTE — Discharge Instructions (Addendum)
  Diet: Resume home heart healthy regular diet.   Activity: No heavy lifting >20 pounds (children, pets, laundry, garbage) or strenuous activity until follow-up, but light activity and walking are encouraged. Do not drive or drink alcohol if taking narcotic pain medications.  Wound care: May shower with soapy water and pat dry (do not rub incisions), but no baths or submerging incision underwater until follow-up. (no swimming)   Medications: Resume all home medications. For mild to moderate pain: acetaminophen (Tylenol) ***or ibuprofen (if no kidney disease). Combining Tylenol with alcohol can substantially increase your risk of causing liver disease. Narcotic pain medications, if prescribed, can be used for severe pain, though may cause nausea, constipation, and drowsiness. Do not combine Tylenol and Norco within a 6 hour period as Norco contains Tylenol. If you do not need the narcotic pain medication, you do not need to fill the prescription.  Call office ([phone]) at any time if any questions, worsening pain, fevers/chills, bleeding, drainage from incision site, or other concerns.   AMBULATORY SURGERY  DISCHARGE INSTRUCTIONS   The drugs that you were given will stay in your system until tomorrow so for the next 24 hours you should not:  Drive an automobile Make any legal decisions Drink any alcoholic beverage   You may resume regular meals tomorrow.  Today it is better to start with liquids and gradually work up to solid foods.  You may eat anything you prefer, but it is better to start with liquids, then soup and crackers, and gradually work up to solid foods.   Please notify your doctor immediately if you have any unusual bleeding, trouble breathing, redness and pain at the surgery site, drainage, fever, or pain not relieved by medication.    Additional Instructions:        Please contact your physician with any problems or Same Day Surgery at [phone], Monday  through Friday 6 am to 4 pm, or [facility] Main number at [phone].  

## 2021-08-21 NOTE — Anesthesia Preprocedure Evaluation (Signed)
Anesthesia Evaluation  Patient identified by MRN, date of birth, ID band Patient awake    Reviewed: Allergy & Precautions, NPO status , Patient's Chart, lab work & pertinent test results  Airway Mallampati: II  TM Distance: >3 FB Neck ROM: Full    Dental no notable dental hx.    Pulmonary neg pulmonary ROS, Current Smoker and Patient abstained from smoking.,    Pulmonary exam normal        Cardiovascular negative cardio ROS Normal cardiovascular exam     Neuro/Psych negative neurological ROS  negative psych ROS   GI/Hepatic negative GI ROS, Neg liver ROS,   Endo/Other  negative endocrine ROS  Renal/GU negative Renal ROS  negative genitourinary   Musculoskeletal negative musculoskeletal ROS (+)   Abdominal   Peds negative pediatric ROS (+)  Hematology negative hematology ROS (+)   Anesthesia Other Findings   Reproductive/Obstetrics negative OB ROS                            Anesthesia Physical Anesthesia Plan  ASA: 2  Anesthesia Plan: General   Post-op Pain Management:    Induction: Intravenous  PONV Risk Score and Plan: 2 and Propofol infusion  Airway Management Planned: Oral ETT  Additional Equipment:   Intra-op Plan:   Post-operative Plan: Extubation in OR  Informed Consent: I have reviewed the patients History and Physical, chart, labs and discussed the procedure including the risks, benefits and alternatives for the proposed anesthesia with the patient or authorized representative who has indicated his/her understanding and acceptance.       Plan Discussed with: CRNA, Anesthesiologist and Surgeon  Anesthesia Plan Comments:         Anesthesia Quick Evaluation

## 2021-08-21 NOTE — Transfer of Care (Signed)
Immediate Anesthesia Transfer of Care Note  Patient: John Warner  Procedure(s) Performed: HERNIA REPAIR UMBILICAL ADULT (Abdomen)  Patient Location: PACU  Anesthesia Type:General  Level of Consciousness: drowsy  Airway & Oxygen Therapy: Patient Spontanous Breathing and Patient connected to face mask oxygen  Post-op Assessment: Report given to RN and Post -op Vital signs reviewed and stable  Post vital signs: Reviewed and stable  Last Vitals:  Vitals Value Taken Time  BP 127/60 08/21/21 1141  Temp    Pulse 62 08/21/21 1144  Resp 19 08/21/21 1144  SpO2 98 % 08/21/21 1144  Vitals shown include unvalidated device data.  Last Pain:  Vitals:   08/21/21 0900  TempSrc: Temporal  PainSc: 1          Complications: No notable events documented.

## 2021-08-21 NOTE — Anesthesia Procedure Notes (Signed)
Procedure Name: Intubation Date/Time: 08/21/2021 10:37 AM Performed by: Joanette Gula, Shara Hartis, CRNA Pre-anesthesia Checklist: Patient identified, Emergency Drugs available, Suction available and Patient being monitored Patient Re-evaluated:Patient Re-evaluated prior to induction Oxygen Delivery Method: Circle system utilized Preoxygenation: Pre-oxygenation with 100% oxygen Induction Type: IV induction Ventilation: Mask ventilation without difficulty Laryngoscope Size: McGraph and 4 Grade View: Grade I Tube type: Oral Tube size: 7.5 mm Number of attempts: 1 Airway Equipment and Method: Stylet Placement Confirmation: ETT inserted through vocal cords under direct vision, positive ETCO2 and breath sounds checked- equal and bilateral Secured at: 23 cm Tube secured with: Tape Dental Injury: Teeth and Oropharynx as per pre-operative assessment

## 2021-08-21 NOTE — Op Note (Signed)
Preoperative diagnosis: Umbilical hernia.   Postoperative diagnosis: Umbilical hernia.   Procedure: Umbilical hernia repair  Anesthesia: GETA   Surgeon: Dr. Hazle Quant   Wound Classification: Clean   Indications: Patient is a 35 y.o. male developed a symptomatic umbilical hernia. This was symptomatic and repair was indicated.    Findings: 1. A 1 cm umbilical hernia  2. A 1 cm cystic mass incarcerated on the umbilical hernia.  3. No hollow viscus organ injury identified during procedure 4. Tension free repair achieved 5. Adequate hemostasis   Description of procedure: The patient was brought to the operating room and general anesthesia was induced. A time-out was completed verifying correct patient, procedure, site, positioning, and implant(s) and/or special equipment prior to beginning this procedure. Antibiotics were administered prior to making the incision. The anterior abdominal wall was prepped and draped in the standard sterile fashion. A infra umbilical incision was made. The incision was deepened to the fascia. The hernia sac was then identified and dissected free. The peritoneum of the sac was dissected from the umbilical stalk and able to be reduced easily. There was a small cyst incarcerated on the cyst that was removed and sent to pathology. The fascia was carefully palpated no additional defects were identified. The hernia defect was closed with interrupted  Ethibond 0 suture on midline. The umbilical stalk was fixed to fascia with 0 Vicryl and the skin was closed with subcuticular sutures of Monocryl 4-0. Wound covered with Dermabond.   The patient tolerated the procedure well and was brought to the postanesthesia care unit in stable condition.    Specimen: Umbilical cyst   Complications: None   Estimated Blood Loss: 5 mL

## 2021-08-22 LAB — SURGICAL PATHOLOGY

## 2023-05-13 ENCOUNTER — Ambulatory Visit: Payer: Managed Care, Other (non HMO) | Admitting: Urology

## 2023-05-27 ENCOUNTER — Ambulatory Visit: Payer: Managed Care, Other (non HMO) | Admitting: Urology

## 2023-06-10 ENCOUNTER — Ambulatory Visit: Payer: BC Managed Care – PPO | Admitting: Urology

## 2023-06-10 ENCOUNTER — Encounter: Payer: Self-pay | Admitting: Urology

## 2023-06-10 VITALS — BP 121/78 | HR 52 | Ht 72.0 in | Wt 267.0 lb

## 2023-06-10 DIAGNOSIS — Z3009 Encounter for other general counseling and advice on contraception: Secondary | ICD-10-CM

## 2023-06-10 DIAGNOSIS — Z125 Encounter for screening for malignant neoplasm of prostate: Secondary | ICD-10-CM

## 2023-06-10 MED ORDER — DIAZEPAM 5 MG PO TABS: 5.0000 mg | ORAL_TABLET | Freq: Once | ORAL | 0 refills | Status: AC | PRN

## 2023-06-10 NOTE — Patient Instructions (Signed)
Pre-Vasectomy Instructions  STOP all aspirin or blood thinners (Aspirin, Plavix, Coumadin, Warfarin, Motrin, Ibuprofen, Advil, Aleve, Naproxen, Naprosyn) for 7 days prior to the procedure.  If you have any questions about stopping these medications please contact your primary care physician or cardiologist.  Shave all hair from the upper scrotum on the day of the procedure.  This means just under the penis onto the scrotal sac.  The area shaved should measure about 2-3 inches around.  You may lather the scrotum with soap and water, and shave with a safety razor.  After shaving the area, thoroughly wash the penis and the scrotum, then shower or bathe to remove all the loose hairs.  If needed, wash the area again just before coming in for your Vasectomy.  It is recommended to have a light meal an hour or so prior to the procedure.  Bring a scrotal support (jock strap or suspensory, or tight jockey shorts or underwear).  Wear comfortable pants or shorts.  While the actual procedure usually takes about 20 minutes, you should be prepared to stay in the office for approximately one hour.  Bring someone with you to drive you home.  If you have any questions or concerns, please feel free to call the office at [phone].  Vasectomy Vasectomy is a procedure in which the vas deferens is cut and then tied or burned (cauterized). The vas deferens is a tube that carries sperm from the testicle to the part of the body that drains urine from the bladder (urethra). This procedure blocks sperm from going through the vas deferens and penis during ejaculation. This ensures that sperm does not go into the vagina during sex. Vasectomy does not affect sexual desire or performance and does not prevent sexually transmitted infections. Vasectomy is considered a permanent and very effective form of birth control (contraception). The decision to have a vasectomy should not be made during a stressful time, such as after  the loss of a pregnancy or a divorce. You and your partner should decide on whether to have a vasectomy when you are sure that you do not want children in the future. Tell a health care provider about: Any allergies you have. All medicines you are taking, including vitamins, herbs, eye drops, creams, and over-the-counter medicines. Any problems you or family members have had with anesthetic medicines. Any blood disorders you have. Any surgeries you have had. Any medical conditions you have. What are the risks? Generally, this is a safe procedure. However, problems may occur, including: Infection. Bleeding and swelling of the scrotum. The scrotum is the sac that contains the testicles, blood vessels, and structures that help deliver sperm and semen. Allergic reactions to medicines. Failure of the procedure to prevent pregnancy. There is a very small chance that the tied or cauterized ends of the vas deferens may reconnect (recanalization). If this happens, you could still make a woman pregnant. Pain in the scrotum that continues after you heal from the procedure. What happens before the procedure? Medicines Ask your health care provider about: Changing or stopping your regular medicines. This is especially important if you are taking diabetes medicines or blood thinners. Taking medicines such as aspirin and ibuprofen. These medicines can thin your blood. Do not take these medicines unless your health care provider tells you to take them. Taking over-the-counter medicines, vitamins, herbs, and supplements. You may be told to take a medicine to help you relax (sedative) a few hours before the procedure. General instructions Do not  use any products that contain nicotine or tobacco for at least 4 weeks before the procedure. These products include cigarettes, e-cigarettes, and chewing tobacco. If you need help quitting, ask your health care provider. Plan to have a responsible adult take you home  from the hospital or clinic. If you will be going home right after the procedure, plan to have a responsible adult care for you for the time you are told. This is important. Ask your health care provider: How your surgery site will be marked. What steps will be taken to help prevent infection. These steps may include: Removing hair at the surgery site. Washing skin with a germ-killing soap. Taking antibiotic medicine. What happens during the procedure?  You will be given one or more of the following: A sedative, unless you were told to take this a few hours before the procedure. A medicine to numb the area (local anesthetic). Your health care provider will feel, or palpate, for your vas deferens. To reach the vas deferens, one of two methods may be used: A very small incision may be made in your scrotum. A punctured opening may be made in your scrotum, without an incision. Your vas deferens will be pulled out of your scrotum and cut. Then, the vas deferens will be closed in one of two ways: Tied at the ends. Cauterized at the ends to seal them off. The vas deferens will be put back into your scrotum. The incision or puncture opening will be closed with absorbable stitches (sutures). The sutures will eventually dissolve and will not need to be removed after the procedure. The procedure will be repeated on the other side of your scrotum. The procedure may vary among health care providers and hospitals. What happens after the procedure? You will be monitored to make sure that you do not have problems. You will be asked not to ejaculate for at least 1 week after the procedure, or for as long as you are told. You will need to use a different form of contraception for 2-4 months after the procedure, until you have test results confirming that there are no sperm in your semen. You may be given scrotal support to wear, such as a jockstrap or underwear with a supportive pouch. If you were given a  sedative during the procedure, it can affect you for several hours. Do not drive or operate machinery until your health care provider says that it is safe. Summary Vasectomy blocks sperm from being released during ejaculation. This procedure is considered a permanent and very effective form of birth control. Your scrotum will be numbed with medicine (local anesthetic) for the procedure. After the procedure, you will be asked not to ejaculate for at least 1 week, or for as long as you are told. You will also need to use a different form of contraception until your test results confirm that there are no sperm in your semen. This information is not intended to replace advice given to you by your health care provider. Make sure you discuss any questions you have with your health care provider. Document Revised: 04/19/2020 Document Reviewed: 04/19/2020 Elsevier Patient Education  2023 [employer]  Vasectomy, Care After This sheet gives you information about how to care for yourself after your procedure. Your health care provider may also give you more specific instructions. If you have problems or questions, contact your health care provider. What can I expect after the procedure? After the procedure, it is common to have: Mild pain, swelling,   or discomfort in your scrotum or redness on your scrotum. Some blood coming from your incisions or puncture sites for 1 or 2 days. Blood in your semen. Follow these instructions at home: Medicines Take over-the-counter and prescription medicines only as told by your health care provider. Avoid taking any medicines that contain aspirin or NSAIDs, such as ibuprofen. These medicines can make bleeding worse. Activity For the first 2 days after surgery, avoid physical activity and exercise that requires a lot of energy. Ask your health care provider what activities are safe for you. Do not take part in sports or perform heavy physical labor until your pain has  improved, or until your health care provider says it is okay. You may have limits on the amount of weight you can lift as told by your health care provider. Do not ejaculate for at least 1 week after the procedure, or for as long as you are told. You may resume sexual activity 7-10 days after your procedure, or when your health care provider approves. Use a different method of birth control (contraception) until you have had test results that confirm that there is no sperm in your semen. Scrotal support Use scrotal support, such as a jockstrap or underwear with a supportive pouch, as needed for 1 week after your procedure. If you feel discomfort in your scrotum, you may remove the scrotal support to see if the discomfort is relieved. Sometimes scrotal support can press on the scrotum and cause or worsen discomfort. If your skin gets irritated, you may add some germ-free (sterile), fluffed bandages or a clean washcloth to the scrotal support. Managing pain and swelling If directed, put ice on the affected area. To do this: Put ice in a plastic bag. Place a towel between your skin and the bag. Leave the ice on for 15 minutes, 3-4 times a day. Remove the ice if your skin turns bright red. This is very important. If you cannot feel pain, heat, or cold, you have a greater risk of damage to the area.  General instructions  Check your incisions or puncture sites every day for signs of infection. Check for: Redness, swelling, or more pain. Fluid or blood. Warmth. Pus or a bad smell. Leave stitches (sutures) in place. The sutures will dissolve on their own and do not need to be removed. Keep all follow-up visits. This is important because you will need a test to confirm that there is no sperm in your semen. Multiple ejaculations are needed to clear out sperm that were beyond the vasectomy site. You will need one test result showing that there is no sperm in your semen before you can resume unprotected  sex. This may take 2-4 months after your procedure. If you were given a sedative during the procedure, it can affect you for several hours. Do not drive or operate machinery until your health care provider says that it is safe. Contact a health care provider if: You have redness, swelling, or more pain around an incision or puncture site, or in your scrotum area. You have bleeding from an incision or puncture site. You have pus or a bad smell coming from an incision or puncture site. You have a fever. An incision or puncture site opens up. Get help right away if: You develop a rash. You have trouble breathing. Summary After your procedure, it is common to have mild pain, swelling, redness, or discomfort in your scrotum. For the first 2 days after surgery, avoid physical activity and   exercise that requires a lot of energy. Put ice on the affected area. Leave the ice on for 20 minutes, 2-3 times a day. If you were given a sedative during the procedure, it can affect you for several hours. Do not drive or operate machinery until your health care provider says that it is safe. This information is not intended to replace advice given to you by your health care provider. Make sure you discuss any questions you have with your health care provider. Document Revised: 04/19/2020 Document Reviewed: 04/19/2020 Elsevier Patient Education  2023 [employer]  Prostate Cancer Screening  With your family history of prostate cancer, I would recommend having PSA checked starting at age 68 with your primary care doctor   Prostate cancer screening is testing that is done to check for the presence of prostate cancer in men. The prostate gland is a walnut-sized gland that is located below the bladder and in front of the rectum in males. The function of the prostate is to add fluid to semen during ejaculation. Prostate cancer is one of the most common types of cancer in men. Who should have prostate cancer  screening? Screening recommendations vary based on age and other risk factors, as well as between the professional organizations who make the recommendations. In general, screening is recommended if: You are age 11 to 58 and have an average risk for prostate cancer. You should talk with your health care provider about your need for screening and how often screening should be done. Because most prostate cancers are slow growing and will not cause death, screening in this age group is generally reserved for men who have a 10- to 15-year life expectancy. You are younger than age 63, and you have these risk factors: Having a father, brother, or uncle who has been diagnosed with prostate cancer. The risk is higher if your family member's cancer occurred at an early age or if you have multiple family members with prostate cancer at an early age. Being a male who is [place] or is of [place] or sub-Saharan African descent. In general, screening is not recommended if: You are younger than age 40. You are between the ages of 20 and 55 and you have no risk factors. You are 9 years of age or older. At this age, the risks that screening can cause are greater than the benefits that it may provide. If you are at high risk for prostate cancer, your health care provider may recommend that you have screenings more often or that you start screening at a younger age. How is screening for prostate cancer done? The recommended prostate cancer screening test is a blood test called the prostate-specific antigen (PSA) test. PSA is a protein that is made in the prostate. As you age, your prostate naturally produces more PSA. Abnormally high PSA levels may be caused by: Prostate cancer. An enlarged prostate that is not caused by cancer (benign prostatic hyperplasia, or BPH). This condition is very common in older men. A prostate gland infection (prostatitis) or urinary tract infection. Certain medicines such as male hormones  (like testosterone) or other medicines that raise testosterone levels. A rectal exam may be done as part of prostate cancer screening to help provide information about the size of your prostate gland. When a rectal exam is performed, it should be done after the PSA level is drawn to avoid any effect on the results. Depending on the PSA results, you may need more tests, such as: A physical  exam to check the size of your prostate gland, if not done as part of screening. Blood and imaging tests. A procedure to remove tissue samples from your prostate gland for testing (biopsy). This is the only way to know for certain if you have prostate cancer. What are the benefits of prostate cancer screening? Screening can help to identify cancer at an early stage, before symptoms start and when the cancer can be treated more easily. There is a small chance that screening may lower your risk of dying from prostate cancer. The chance is small because prostate cancer is a slow-growing cancer, and most men with prostate cancer die from a different cause. What are the risks of prostate cancer screening? The main risk of prostate cancer screening is diagnosing and treating prostate cancer that would never have caused any symptoms or problems. This is called overdiagnosisand overtreatment. PSA screening cannot tell you if your PSA is high due to cancer or a different cause. A prostate biopsy is the only procedure to diagnose prostate cancer. Even the results of a biopsy may not tell you if your cancer needs to be treated. Slow-growing prostate cancer may not need any treatment other than monitoring, so diagnosing and treating it may cause unnecessary stress or other side effects. Questions to ask your health care provider When should I start prostate cancer screening? What is my risk for prostate cancer? How often do I need screening? What type of screening tests do I need? How do I get my test results? What do my  results mean? Do I need treatment? Where to find more information The American Cancer Society: www.cancer.org American Urological Association: www.auanet.org Contact a health care provider if: You have difficulty urinating. You have pain when you urinate or ejaculate. You have blood in your urine or semen. You have pain in your back or in the area of your prostate. Summary Prostate cancer is a common type of cancer in men. The prostate gland is located below the bladder and in front of the rectum. This gland adds fluid to semen during ejaculation. Prostate cancer screening may identify cancer at an early stage, when the cancer can be treated more easily and is less likely to have spread to other areas of the body. The prostate-specific antigen (PSA) test is the recommended screening test for prostate cancer, but it has associated risks. Discuss the risks and benefits of prostate cancer screening with your health care provider. If you are age 68 or older, the risks that screening can cause are greater than the benefits that it may provide. This information is not intended to replace advice given to you by your health care provider. Make sure you discuss any questions you have with your health care provider. Document Revised: 05/27/2021 Document Reviewed: 05/27/2021 Elsevier Patient Education  2024 [employer].

## 2023-06-10 NOTE — Progress Notes (Signed)
   06/10/23 9:36 AM   John Warner 1986/11/14 [id number]  CC: Discussed vasectomy, family history of prostate cancer  HPI: Healthy 37 year old male interested in vasectomy for permanent sterilization.  He has 3 children and does not desire any further biologic pregnancies.  He denies any urinary symptoms.  He does have a family history of prostate cancer in his grandfather.   PMH: No past medical history on file.  Surgical History: Past Surgical History:  Procedure Laterality Date   COLONOSCOPY     HERNIA REPAIR     as a child   UMBILICAL HERNIA REPAIR N/A 08/21/2021   Procedure: HERNIA REPAIR UMBILICAL ADULT;  Surgeon: Carolan Shiver, MD;  Location: [facility] ORS;  Service: General;  Laterality: N/A;   Social History:  reports that he has been smoking cigarettes and cigars. He has been exposed to tobacco smoke. He has never used smokeless tobacco. He reports that he does not drink alcohol and does not use drugs.  Physical Exam: BP 121/78   Pulse (!) 52   Ht 6' (1.829 m)   Wt 267 lb (121.1 kg)   BMI 36.21 kg/m    Constitutional:  Alert and oriented, No acute distress. Cardiovascular: No clubbing, cyanosis, or edema. Respiratory: Normal respiratory effort, no increased work of breathing. GI: Abdomen is soft, nontender, nondistended, no abdominal masses GU: Patent meatus, no lesions, testicles descended bilaterally without masses, right testicle slightly atrophic, vas deferens easily palpable bilaterally   Assessment & Plan:   37 year old male interested in vasectomy for permanent sterilization, also with a family history of prostate cancer.  We discussed the risks and benefits of vasectomy at length.  Vasectomy is intended to be a permanent form of contraception, and does not produce immediate sterility.  Following vasectomy another form of contraception is required until vas occlusion is confirmed by a post-vasectomy semen analysis obtained 2-3 months after the  procedure.  Even after vas occlusion is confirmed, vasectomy is not 100% reliable in preventing pregnancy, and the failure rate is approximately 12/1998.  Repeat vasectomy is required in less than 1% of patients.  He should refrain from ejaculation for 1 week after vasectomy.  Options for fertility after vasectomy include vasectomy reversal, and sperm retrieval with in vitro fertilization or ICSI.  These options are not always successful and may be expensive.  Finally, there are other permanent and non-permanent alternatives to vasectomy available. There is no risk of erectile dysfunction, and the volume of semen will be similar to prior, as the majority of the ejaculate is from the prostate and seminal vesicles.   The procedure takes ~20 minutes.  We recommend patients take 5-10 mg of Valium 30 minutes prior, and he will need a driver post-procedure.  Local anesthetic is injected into the scrotal skin and a small segment of the vas deferens is removed, and the ends occluded. The complication rate is approximately 1-2%, and includes bleeding, infection, and development of chronic scrotal pain.  We also reviewed the AUA guidelines regarding the risks and benefits of PSA screening.  With his African-American ancestry and family history I strongly recommended beginning PSA screening at age 77 with PCP.  PLAN: Schedule vasectomy Valium sent to pharmacy  Legrand Rams, MD 06/10/2023  [facility] [address], [address] [place], [place] [address] [phone]

## 2023-07-11 IMAGING — CT CT ABD-PELV W/ CM
2 of 4 series · 16 of 46 positions shown, 18 images · IV contrast (APPLIED)
Comparison: None.

CLINICAL DATA: Right lower quadrant abdominal pain.

EXAM:
CT ABDOMEN AND PELVIS WITH CONTRAST
TECHNIQUE: Multidetector CT imaging of the abdomen and pelvis was performed
using the standard protocol following bolus administration of
intravenous contrast.
CONTRAST:  125mL OMNIPAQUE IOHEXOL 350 MG/ML SOLN

[Series 2: routine abd/pel with · axial · 0.86mm/px · z∈[-1162,-686]mm · 13 of 105 slices shown, 15 images]
[im 5/105  soft-tissue]
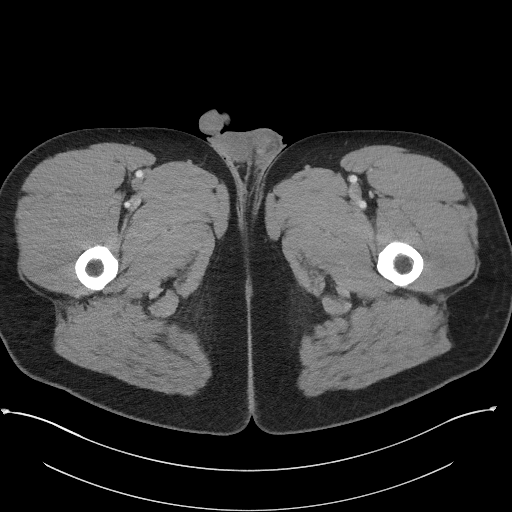
[im 5/105  bone]
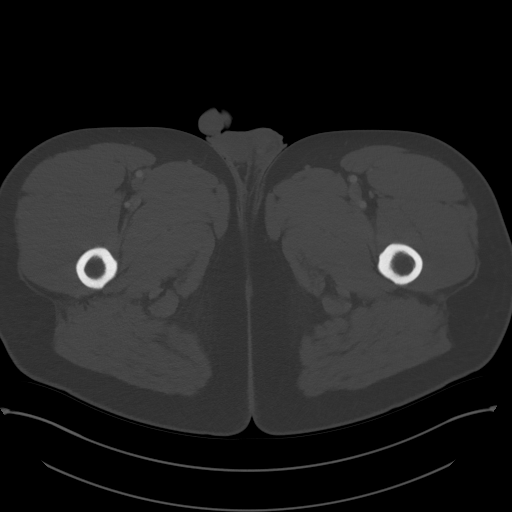
[im 15/105  soft-tissue]
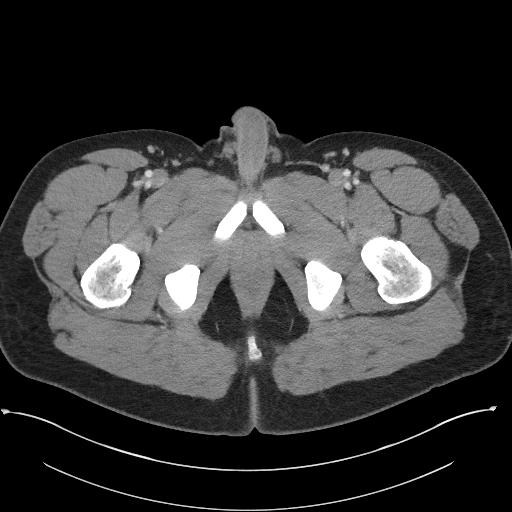
[im 24/105  soft-tissue]
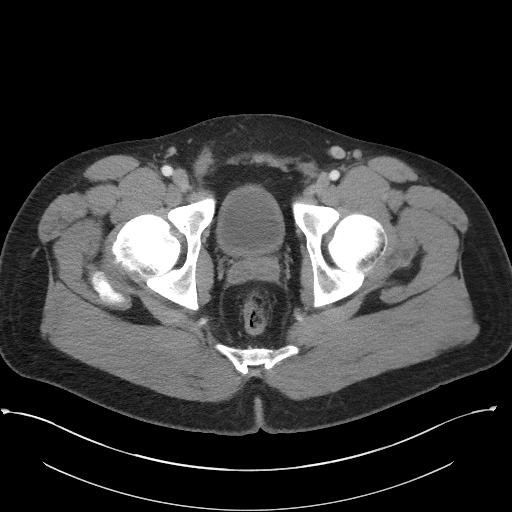
[im 29/105  soft-tissue]
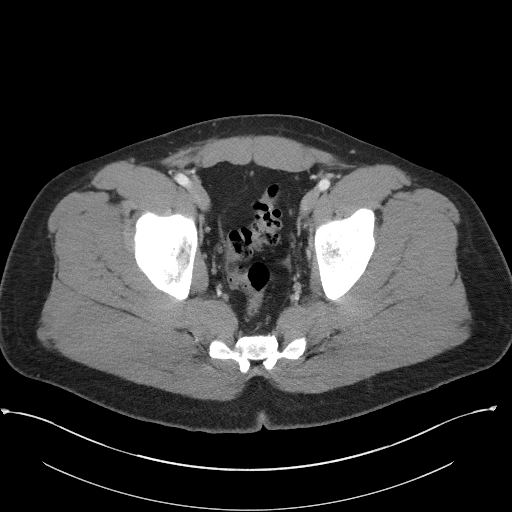
[im 38/105  soft-tissue]
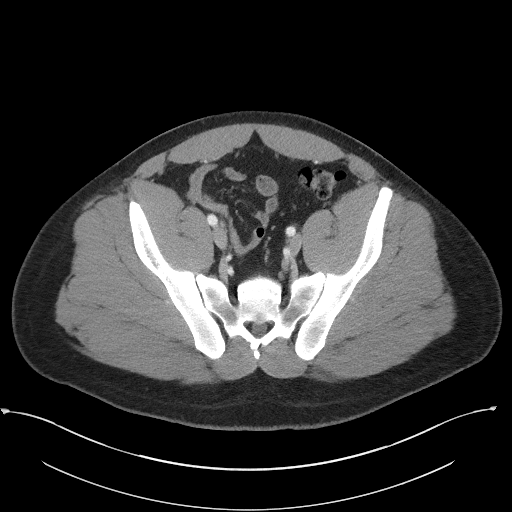
[im 43/105  soft-tissue]
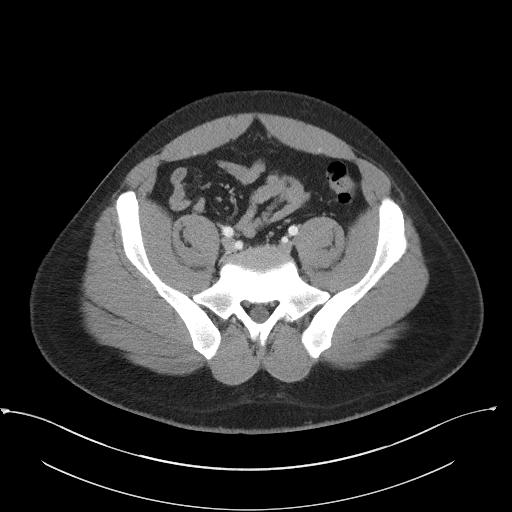
[im 53/105  soft-tissue]
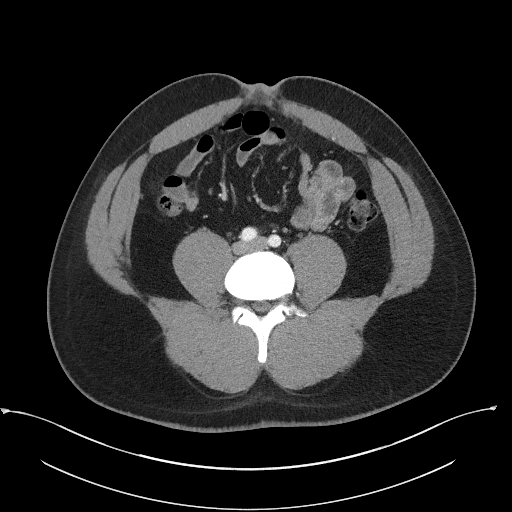
[im 62/105  soft-tissue]
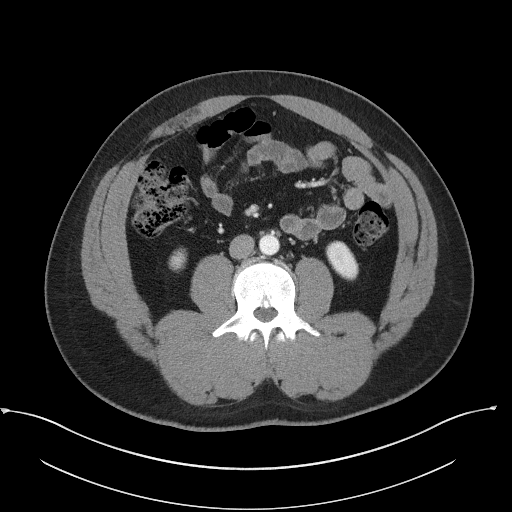
[im 67/105  soft-tissue]
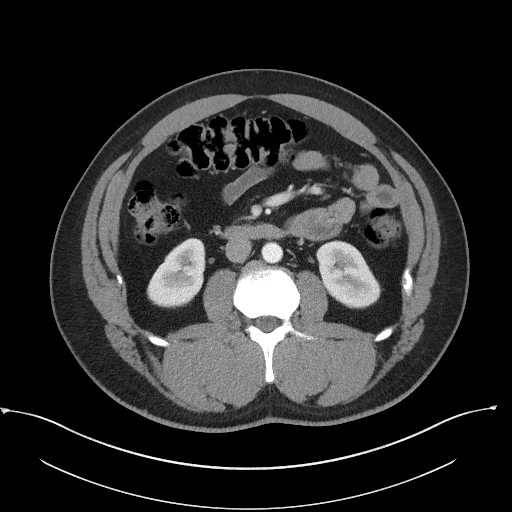
[im 67/105  bone]
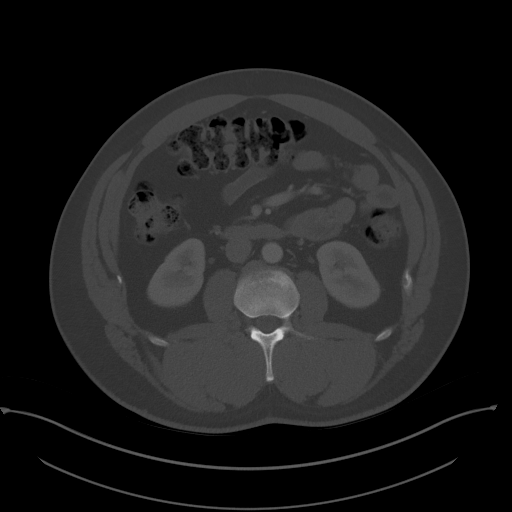
[im 76/105  soft-tissue]
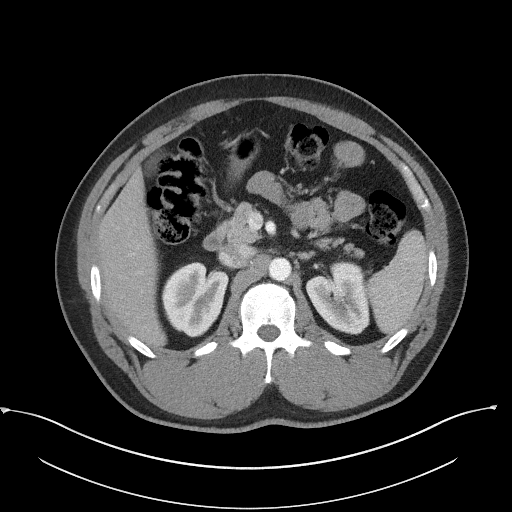
[im 81/105  soft-tissue]
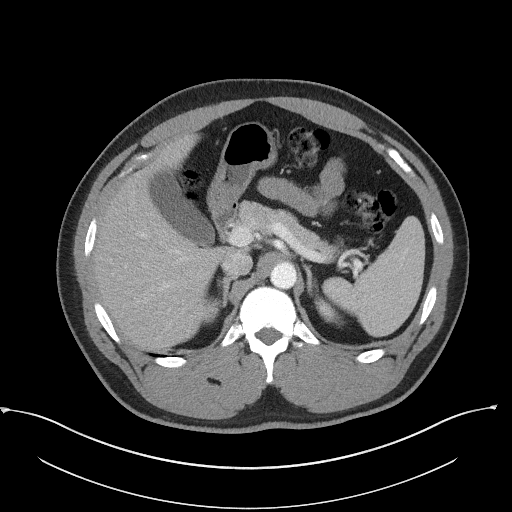
[im 90/105  soft-tissue]
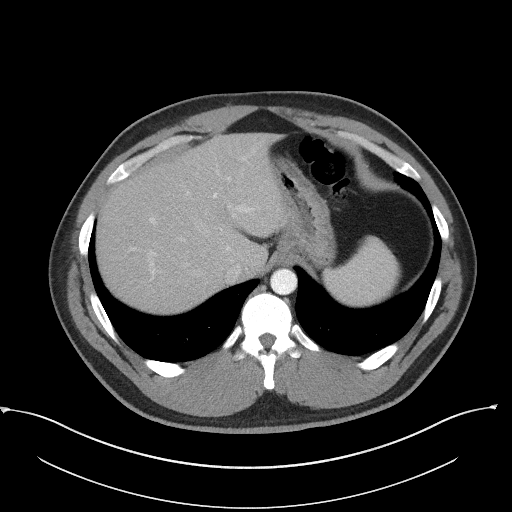
[im 100/105  soft-tissue]
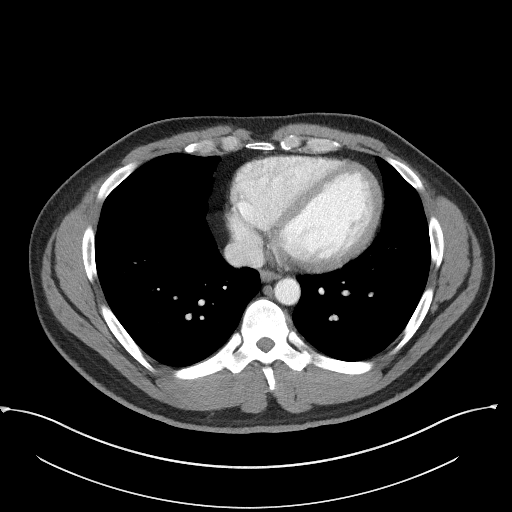

[Series 5: coronal st · coronal · 0.79mm/px · 3 of 104 slices shown]
[im 35/104  soft-tissue]
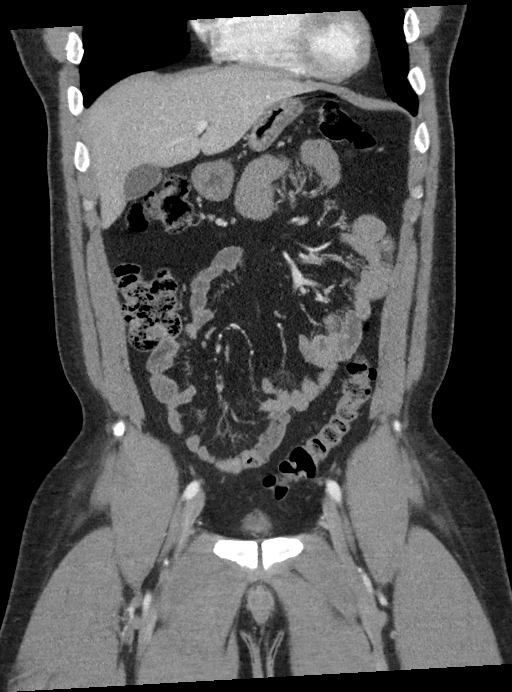
[im 46/104  soft-tissue]
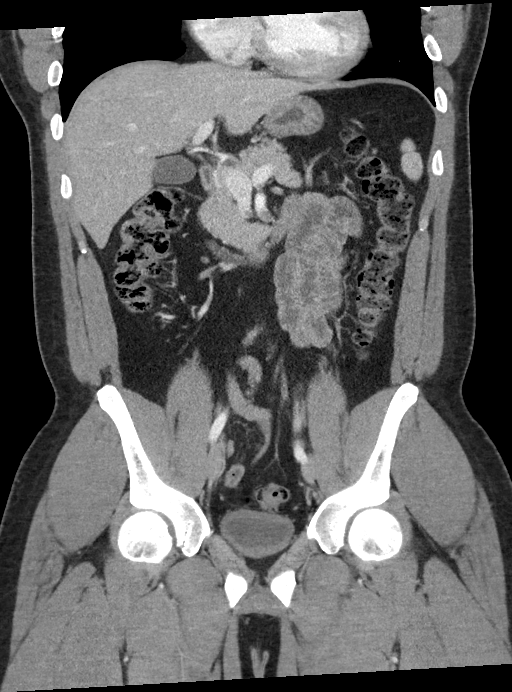
[im 58/104  soft-tissue]
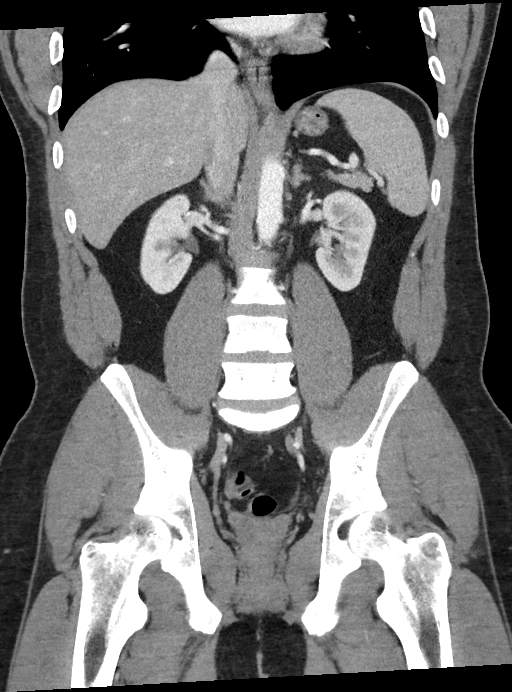

[16 of 46 positions shown; findings below may reference images not displayed]

FINDINGS: Lower chest: The visualized lung bases are clear.

No intra-abdominal free air or free fluid.

Hepatobiliary: No focal liver abnormality is seen. No gallstones,
gallbladder wall thickening, or biliary dilatation.

Pancreas: Unremarkable. No pancreatic ductal dilatation or
surrounding inflammatory changes.

Spleen: Normal in size without focal abnormality.

Adrenals/Urinary Tract: The adrenal glands are unremarkable. The
kidneys, visualized ureters, and the urinary bladder appear
unremarkable.

Stomach/Bowel: Mild thickened appearance of multiple jejunal folds
may be physiologic or represent mild enteritis. Clinical correlation
is recommended. There is no bowel obstruction. The appendix is
normal.

Vascular/Lymphatic: The abdominal aorta and IVC are unremarkable. No
portal venous gas. There is no adenopathy.

Reproductive: The prostate and seminal vesicles are grossly
unremarkable. No pelvic mass

Other: Small fat containing umbilical hernia. There is induration of
the umbilical fat. Correlation with clinical exam is recommended to
exclude strangulation or incarceration. No fluid collection.

Musculoskeletal: No acute or significant osseous findings.
IMPRESSION: 1. Physiologic appearance of the jejunal folds versus mild
enteritis. No bowel obstruction. Normal appendix.
2. Small fat containing umbilical hernia with induration of the
umbilical fat. Correlation with clinical exam is recommended to
exclude strangulation or incarceration.

## 2023-07-30 ENCOUNTER — Ambulatory Visit: Payer: BC Managed Care – PPO | Admitting: Urology

## 2023-07-30 VITALS — Ht 72.0 in | Wt 267.0 lb

## 2023-07-30 DIAGNOSIS — Z302 Encounter for sterilization: Secondary | ICD-10-CM

## 2023-07-30 DIAGNOSIS — Z9852 Vasectomy status: Secondary | ICD-10-CM

## 2023-07-30 NOTE — Progress Notes (Signed)
VASECTOMY PROCEDURE NOTE:  The patient was taken to the minor procedure room and placed in the supine position. His genitals were prepped and draped in the usual sterile fashion. The right vas deferens was brought up to the skin of the right upper scrotum. The skin overlying it was anesthetized with 1% lidocaine without epinephrine, anesthetic was also injected alongside the vas deferens in the direction of the inguinal canal. The no scalpel vasectomy instrument was used to make a small perforation in the scrotal skin. The vasectomy clamp was used to grasp the vas deferens. It was carefully dissected free from surrounding structures. A 1cm segment of the vas was removed, and the cut ends of the mucosa were cauterized. No significant bleeding was noted. The vas deferens was returned to the scrotum. The skin incision was closed with a simple interrupted stitch of 4-0 chromic.  Attention was then turned to the left side. The left vasectomy was performed in the same exact fashion. Sterile dressings were placed over each incision. The patient tolerated the procedure well.  IMPRESSION/DIAGNOSIS: The patient is a 38 year old gentleman who underwent a vasectomy today. Post-procedure instructions were reviewed. I stressed the importance of continuing to use birth control until he provides a semen specimen more than 2 months from now that demonstrates azoospermia.  We discussed return precautions including fever over 101, significant bleeding or hematoma, or uncontrolled pain. I also stressed the importance of avoiding strenuous activity for one week, no sexual activity or ejaculations for 5 days, intermittent icing over the next 48 hours, and scrotal support.   PLAN: The patient will be advised of his semen analysis results when available.  Legrand Rams, MD 07/30/2023

## 2023-07-30 NOTE — Patient Instructions (Signed)

## 2023-10-29 ENCOUNTER — Other Ambulatory Visit: Payer: BC Managed Care – PPO

## 2023-10-29 DIAGNOSIS — Z9852 Vasectomy status: Secondary | ICD-10-CM

## 2023-10-30 LAB — POST-VAS SPERM EVALUATION,QUAL: Volume: 1.1 mL
